# Patient Record
Sex: Male | Born: 1995
Health system: Southern US, Community
[De-identification: ages and names within clinical notes are randomized; demographics above are authoritative.]

## PROBLEM LIST (undated history)

## (undated) DIAGNOSIS — M6750 Plica syndrome, unspecified knee: Secondary | ICD-10-CM

## (undated) DIAGNOSIS — M7650 Patellar tendinitis, unspecified knee: Secondary | ICD-10-CM

## (undated) DIAGNOSIS — J309 Allergic rhinitis, unspecified: Secondary | ICD-10-CM

## (undated) DIAGNOSIS — D573 Sickle-cell trait: Secondary | ICD-10-CM

## (undated) HISTORY — PX: NO PAST SURGERIES: SHX2092

## (undated) HISTORY — DX: Allergic rhinitis, unspecified: J30.9

## (undated) HISTORY — DX: Patellar tendinitis, unspecified knee: M76.50

## (undated) HISTORY — DX: Sickle-cell trait: D57.3

## (undated) HISTORY — DX: Plica syndrome, unspecified knee: M67.50

---
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
## 2015-01-08 IMAGING — CR DG FOOT COMPLETE 3+V*R*
3 series · 3 of 3 positions shown · non-contrast
Comparison: None.

CLINICAL DATA: Pain

RIGHT FOOT COMPLETE - 3+ VIEW

[view not recorded (1 of 3)]
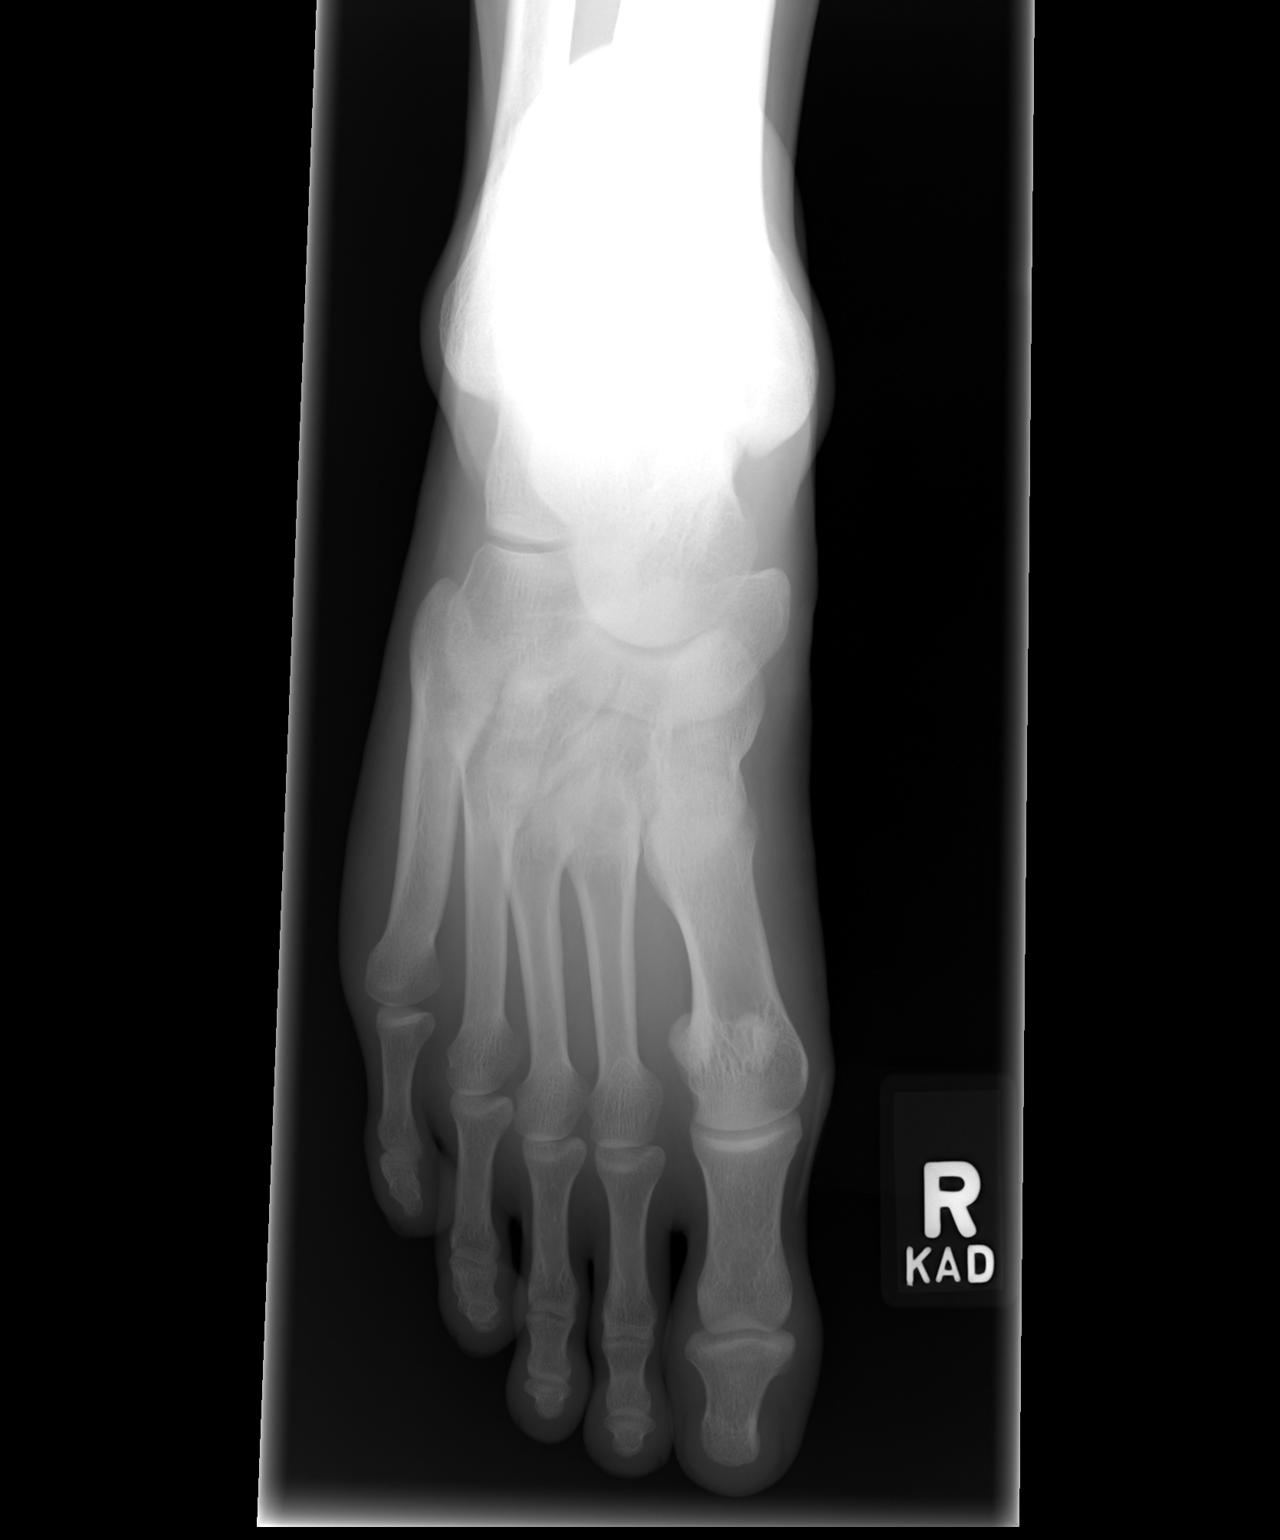

[view not recorded (2 of 3)]
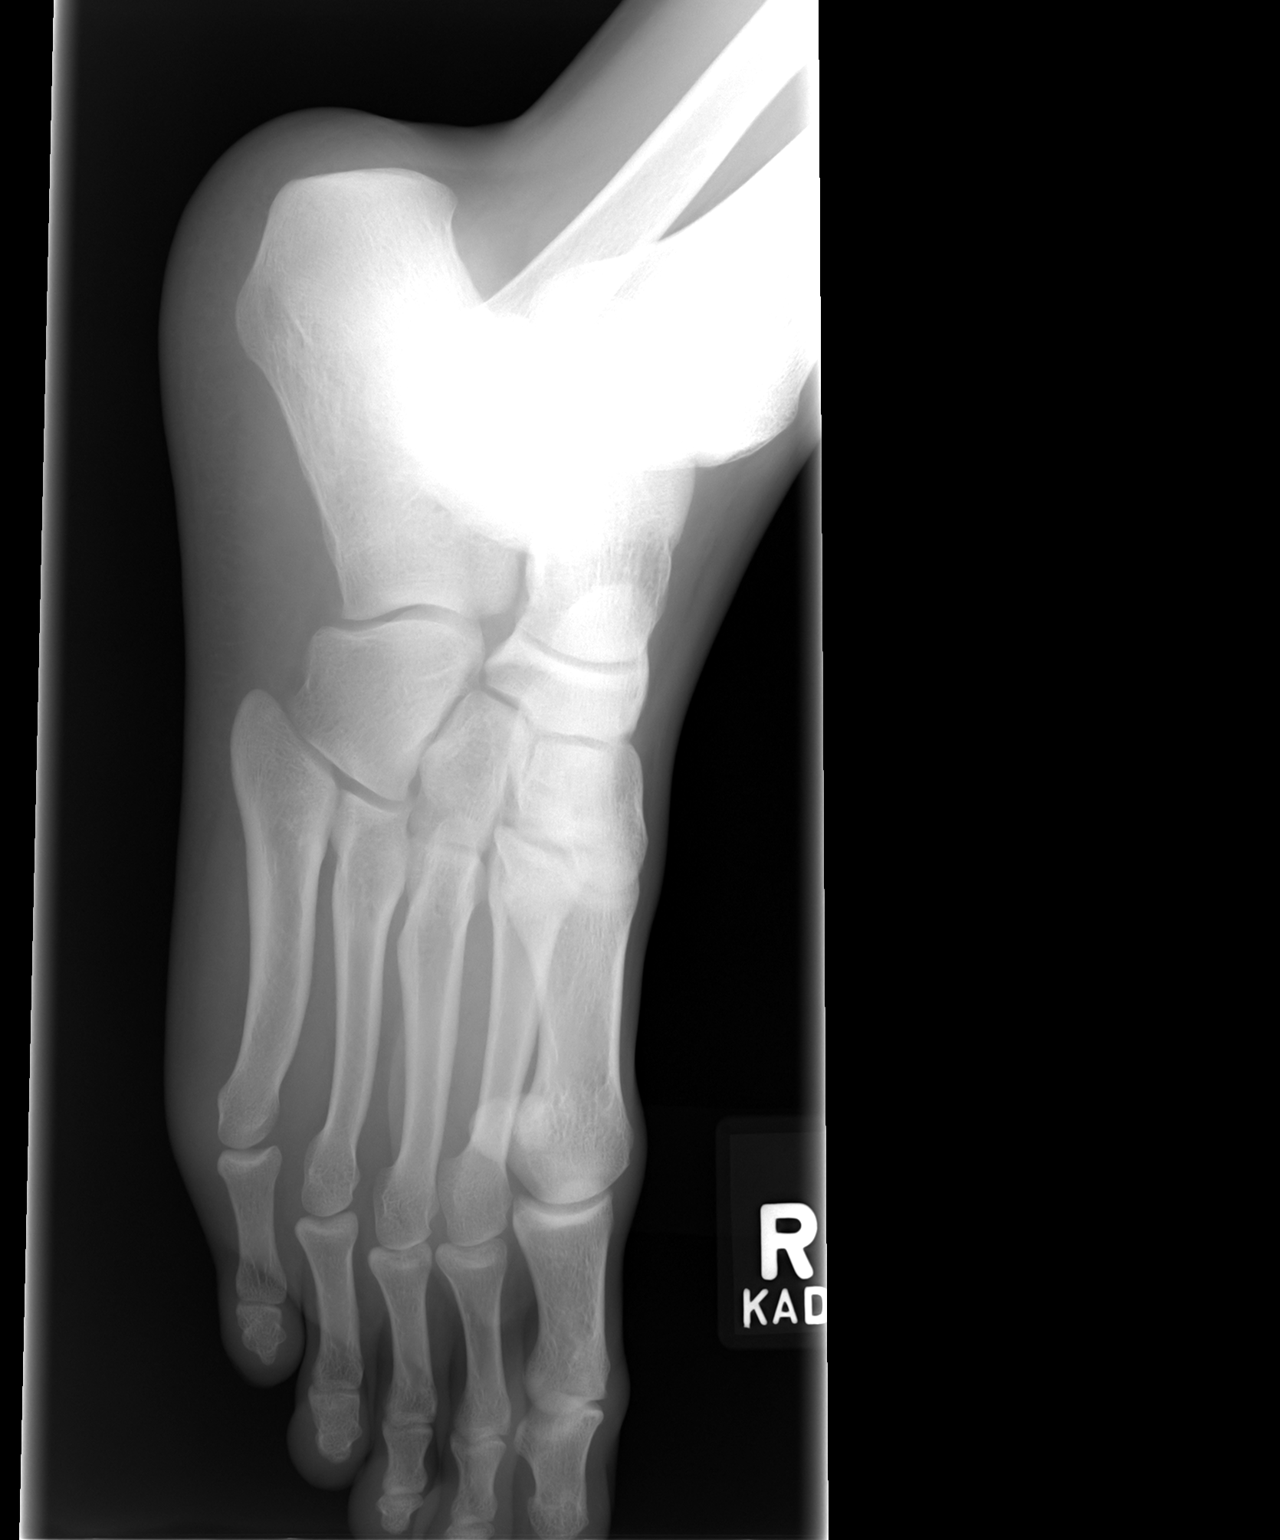

[view not recorded (3 of 3)]
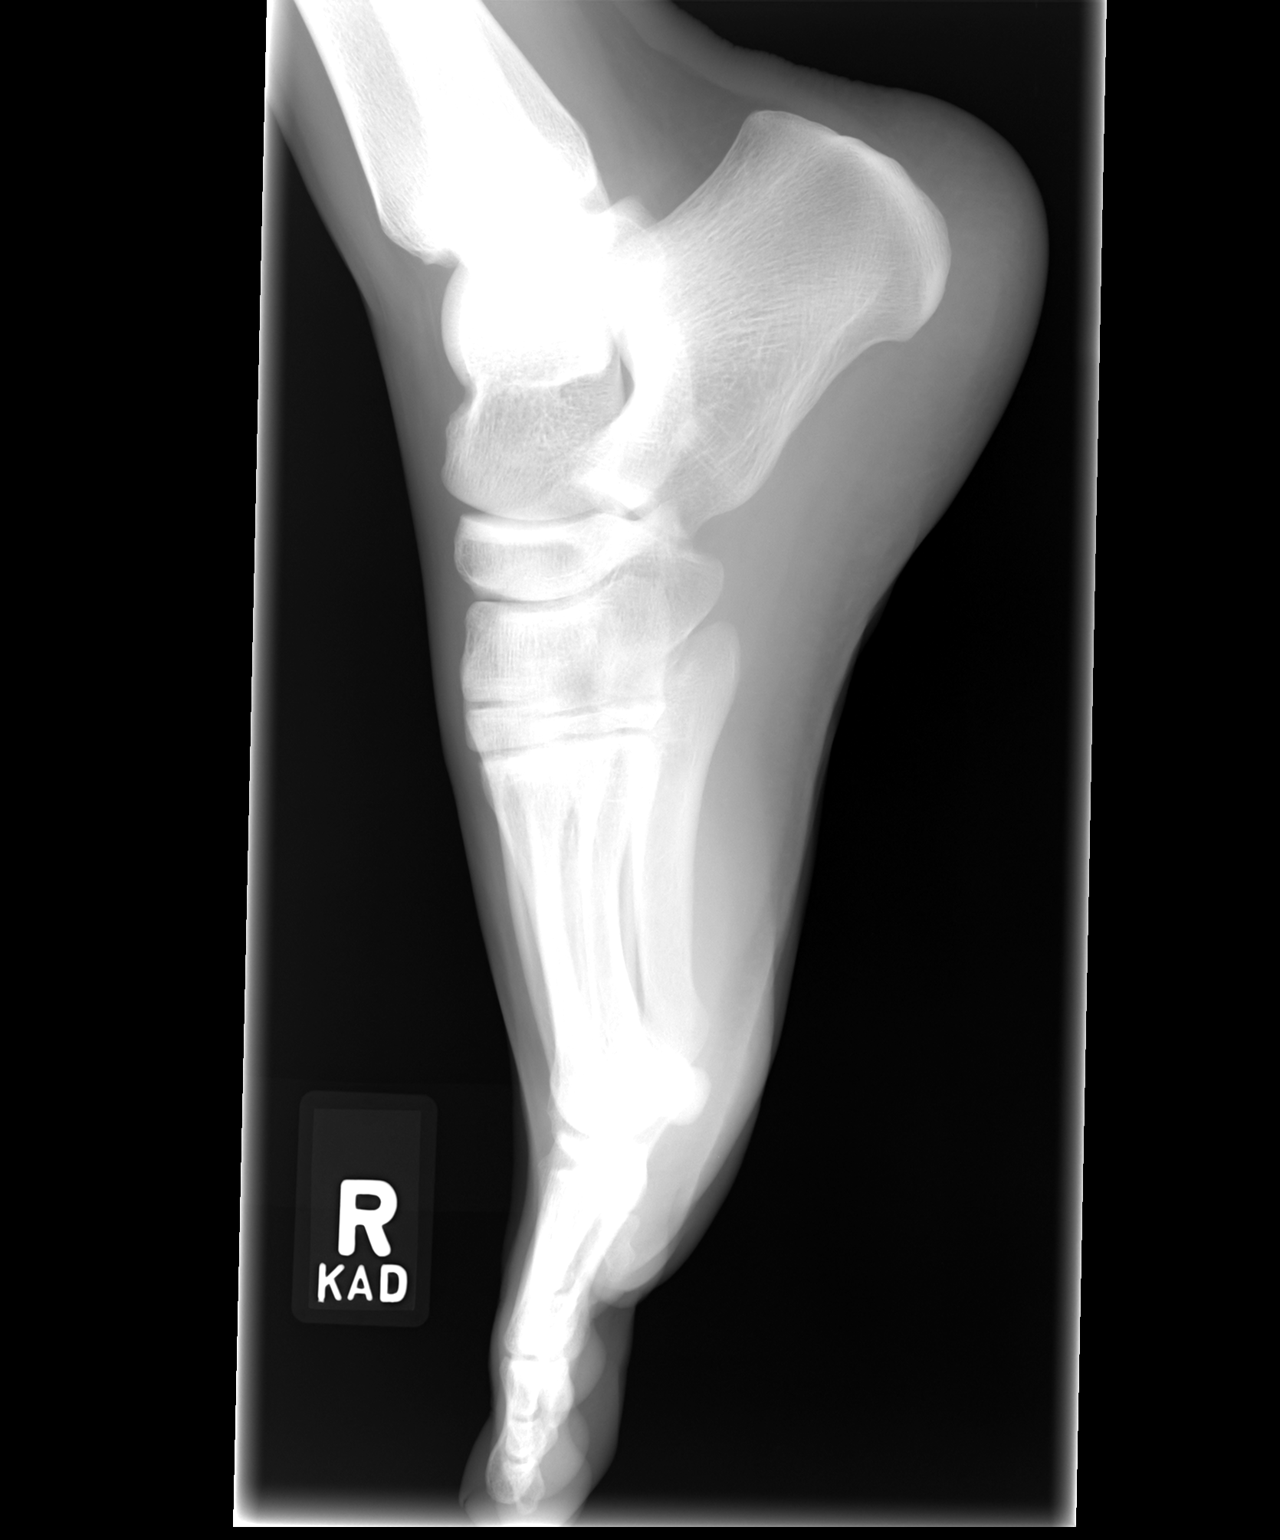

[3 of 3 positions shown; findings below may reference images not displayed]

FINDINGS: Frontal, oblique, and lateral views were obtained.  No
fracture or dislocation.  Joint spaces appear intact.  No erosive
change.
IMPRESSION: No abnormality noted.

## 2015-06-30 ENCOUNTER — Telehealth: Payer: Self-pay | Admitting: Family Medicine

## 2015-06-30 NOTE — Telephone Encounter (Signed)
TELEPHONE ADVICE RECORD [facility]  Patient Name: Cole Sanders  DOB: 12/08/1995    Initial Comment Caller states grandson needs sinus prescription for allergies.   Nurse Assessment  Nurse: Odis LusterBowers, RN, Bjorn Loserhonda Date/Time (Eastern Time): 06/30/2015 5:30:21 PM  Confirm and document reason for call. If symptomatic, describe symptoms. You must click the next button to save text entered. ---Caller reports that he has had some congestion in his nose, sneezing and watery eyes. has this problem when the seasons change. Reports that he usually takes a nasal spray and some for allergies. Reports that he hasn't seen the MD for awhile for allergies. Montelukast (Singulair), has been taking since Dec. 10 mg daily.  Has the patient traveled out of the country within the last 30 days? ---No  Does the patient have any new or worsening symptoms? ---Yes  Will a triage be completed? ---Yes  Related visit to physician within the last 2 weeks? ---No  Does the PT have any chronic conditions? (i.e. diabetes, asthma, etc.) ---Yes  List chronic conditions. ---allergies,  Is this a behavioral health or substance abuse call? ---No     Guidelines    Guideline Title Affirmed Question Affirmed Notes  Nasal Allergies (Hay Fever) [1] Nasal allergies AND [2] only certain times of year (hay fever) (all triage questions negative)    Final Disposition User   Home Care Scott AFBBowers, RN, Bjorn Loserhonda    Comments  Grandmother, caller, explained that she is not currently with the patient, he has gone back to school and his number is [phone].  Caller requests that allergy medication on file be filled when possible, his grandparents will pick up and get to him at school. He will use OTC meds until that time.   Disagree/Comply: Comply

## 2015-07-01 MED ORDER — FLUTICASONE PROPIONATE 50 MCG/ACT NA SUSP: NASAL | Status: DC

## 2015-07-01 MED ORDER — FLUTICASONE PROPIONATE 50 MCG/ACT NA SUSP: NASAL | Status: AC

## 2015-07-01 NOTE — Telephone Encounter (Signed)
I refilled his flonase-please call it in since he is away- ? Which pharmacy

## 2015-07-01 NOTE — Telephone Encounter (Signed)
Pt notified Rx filled pt wanted Rx to go to pharmacy on file and he will have his grandparents pick it up for him

## 2015-07-18 ENCOUNTER — Telehealth: Payer: Self-pay | Admitting: Family Medicine

## 2015-07-18 NOTE — Telephone Encounter (Signed)
PLEASE NOTE: All timestamps contained within this report are represented as [place] Standard Time. CONFIDENTIALTY NOTICE: This fax transmission is intended only for the addressee. It contains information that is legally privileged, confidential or otherwise protected from use or disclosure. If you are not the intended recipient, you are strictly prohibited from reviewing, disclosing, copying using or disseminating any of this information or taking any action in reliance on or regarding this information. If you have received this fax in error, please notify us immediately by telephone so that we can arrange for its return to us. Phone: [phone], Toll-Free: [phone], Fax: [phone] Page: 1 of 2 Call Id: [id number] [facility] Client TELEPHONE ADVICE RECORD [facility] Patient Name: Cole SproutAYLOR Reesor Gender: Male DOB: 09/28/1995 Age: [age] Y 2 M 27 D Return Phone Number: [phone] (Primary), [phone] (Secondary), [phone] (Alternate) Address: City/State/Zip: [place] Client [facility] Physician [place], Idamae SchullerMarne - MD Contact Type Call Who Is Calling Patient / Member / Family / Caregiver Call Type Triage / Clinical Caller Name Cole Sanders Relationship To Patient Mother Return Phone Number [phone] (Primary) Chief Complaint Headache Reason for Call Symptomatic / Request for Health Information Initial Comment caller states son has nasal allergies, facial pressure and headache Appointment Disposition EMR Appointment Not Necessary Info pasted into Epic Yes PreDisposition Call Doctor Translation No Nurse Assessment Nurse: Stefano GaulStringer, RN, Dwana CurdVera Date/Time (Eastern Time): 07/18/2015 2:41:42 PM Confirm and document reason for call. If symptomatic, describe symptoms. You must click the next button to save text entered. ---Caller states he is congested. Has  sinus headache and pressure. Has had symptoms about 2 days. no fever. Has the patient traveled out of the country within the last 30 days? ---Not Applicable Does the patient have any new or worsening symptoms? ---Yes Will a triage be completed? ---Yes Related visit to physician within the last 2 weeks? ---No Does the PT have any chronic conditions? (i.e. diabetes, asthma, etc.) ---No Is this a behavioral health or substance abuse call? ---No Guidelines Guideline Title Affirmed Question Affirmed Notes Nurse Date/Time Lamount Cohen(Eastern Time) Sinus Pain or Congestion [1] Sinus congestion as part of a cold AND [2] present < 10 days (all triage questions negative) Stefano GaulStringer, RN, Dwana CurdVera 07/18/2015 2:43:41 PM Disp. Time Lamount Cohen(Eastern Time) Disposition Final User 07/18/2015 2:36:16 PM Attempt made - message left Stefano GaulStringer, RN, Dwana CurdVera PLEASE NOTE: All timestamps contained within this report are represented as [place] Standard Time. CONFIDENTIALTY NOTICE: This fax transmission is intended only for the addressee. It contains information that is legally privileged, confidential or otherwise protected from use or disclosure. If you are not the intended recipient, you are strictly prohibited from reviewing, disclosing, copying using or disseminating any of this information or taking any action in reliance on or regarding this information. If you have received this fax in error, please notify us immediately by telephone so that we can arrange for its return to us. Phone: [phone], Toll-Free: [phone], Fax: [phone] Page: 2 of 2 Call Id: [id number] Disp. Time Lamount Cohen(Eastern Time) Disposition Final User 07/18/2015 2:38:28 PM Attempt made - message left Stefano GaulStringer, RN, Dwana CurdVera 07/18/2015 2:47:03 PM Home Care Yes Stefano GaulStringer, RN, Clerance LavVera Caller Understands: Yes Disagree/Comply: Comply Care Advice Given Per Guideline HOME CARE: You should be able to treat this at home. REASSURANCE: * Sinus congestion is a normal part of a cold.  NASAL DECONGESTANTS FOR A VERY STUFFY NOSE: * PSEUDOEPHEDRINE (Sudafed) is available OTC  in pill form. Typical adult dosage is two 30 mg tablets every 6 hours. * OXYMETAZOLINE NASAL DROPS (Afrin) are available OTC. Clean out the nose before using. Spray each nostril once, wait one minute for absorption, and then spray a second time. PAIN MEDICINES: * For pain relief, take acetaminophen, ibuprofen, or naproxen. CALL BACK IF: * Fever lasts over 3 days * Severe pain persists over 2 hours after pain medicine * Sinus congestion (fullness) persists over 10 days * You become worse. CARE ADVICE given per Sinus Pain or Congestion (Adult) guideline. Comments User: Art Buff, RN Date/Time Lamount Cohen Time): 07/18/2015 2:34:06 PM Caller states her son has nasal congestion and headache. She is not with her son. Advised caller that I would try to call her son regarding symptoms. User: Art Buff, RN Date/Time Lamount Cohen Time): 07/18/2015 2:35:53 PM son's phone number is [phone] User: Art Buff, RN Date/Time Lamount Cohen Time): 07/18/2015 2:41:08 PM called mother back and she said to try his grandparent's number at [phone]

## 2015-07-18 NOTE — Telephone Encounter (Signed)
Make an appointment if no improvement with home care recommendations, seek care after hours if symptoms become severe

## 2015-07-18 NOTE — Telephone Encounter (Signed)
Patient Name: Gwyneth SproutAYLOR No  DOB: 10/22/1995    Initial Comment caller states son has nasal allergies, facial pressure and headache   Nurse Assessment  Nurse: Stefano GaulStringer, RN, Dwana CurdVera Date/Time (Eastern Time): 07/18/2015 2:41:42 PM  Confirm and document reason for call. If symptomatic, describe symptoms. You must click the next button to save text entered. ---Caller states he is congested. Has sinus headache and pressure. Has had symptoms about 2 days. no fever.  Has the patient traveled out of the country within the last 30 days? ---Not Applicable  Does the patient have any new or worsening symptoms? ---Yes  Will a triage be completed? ---Yes  Related visit to physician within the last 2 weeks? ---No  Does the PT have any chronic conditions? (i.e. diabetes, asthma, etc.) ---No  Is this a behavioral health or substance abuse call? ---No     Guidelines    Guideline Title Affirmed Question Affirmed Notes  Sinus Pain or Congestion [1] Sinus congestion as part of a cold AND [2] present < 10 days (all triage questions negative)    Final Disposition User   Home Care RodessaStringer, RN, Dwana CurdVera    Comments  Caller states her son has nasal congestion and headache. She is not with her son. Advised caller that I would try to call her son regarding symptoms.  son's phone number is [phone]  called mother back and she said to try his grandparent's number at [phone]   Disagree/Comply: Comply

## 2015-07-18 NOTE — Telephone Encounter (Signed)
Pt last seen 09/11/2012; should pt schedule appt.?

## 2015-07-21 NOTE — Telephone Encounter (Signed)
Father schedule acute appt for pt for tomorrow, father said sxs are still the same

## 2015-07-22 ENCOUNTER — Ambulatory Visit (INDEPENDENT_AMBULATORY_CARE_PROVIDER_SITE_OTHER): Payer: Federal, State, Local not specified - PPO | Admitting: Family Medicine

## 2015-07-22 ENCOUNTER — Encounter: Payer: Self-pay | Admitting: Family Medicine

## 2015-07-22 VITALS — BP 108/76 | HR 51 | Temp 98.2°F | Ht 75.0 in | Wt 194.5 lb

## 2015-07-22 DIAGNOSIS — J0110 Acute frontal sinusitis, unspecified: Secondary | ICD-10-CM | POA: Diagnosis not present

## 2015-07-22 DIAGNOSIS — J302 Other seasonal allergic rhinitis: Secondary | ICD-10-CM | POA: Diagnosis not present

## 2015-07-22 DIAGNOSIS — J019 Acute sinusitis, unspecified: Secondary | ICD-10-CM | POA: Insufficient documentation

## 2015-07-22 MED ORDER — AZITHROMYCIN 250 MG PO TABS: ORAL_TABLET | ORAL | Status: AC

## 2015-07-22 NOTE — Progress Notes (Signed)
Pre visit review using our clinic review tool, if applicable. No additional management support is needed unless otherwise documented below in the visit note. 

## 2015-07-22 NOTE — Patient Instructions (Signed)
Continue flonase through the allergy season (at least another month) for allergies  Take zpak for sinus infection Drink lots of fluids Warm compresses on face and breathing steam also help  advil is ok - take it with food   Update if not starting to improve in a week or if worsening

## 2015-07-22 NOTE — Progress Notes (Signed)
Subjective:    Patient ID: Cole Sanders, male    DOB: 04/28/1995, 20 y.o.   MRN: [id number]  HPI Thinks he has a sinus infection   Has allergies - baseline congestion   Saturday- had worse congestion and headache - mid forehead  Mucous was green/yellow  Some cough-no fever (dry cough)  Ears feel ok  Throat - feels drainage but not sore   advil helps the headache some  Also started back on flonase  Has not used saline   Patient Active Problem List   Diagnosis Date Noted  . Pain of right heel 09/11/2012  . Heart murmur 08/16/2011  . Well adolescent visit 01/08/2011  . TENDINITIS, PATELLAR 08/18/2009  . 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 KNEE PAIN 02/05/2009  . ALLERGIC RHINITIS 12/23/2006  . SICKLE CELL TRAIT 12/20/2006   Past Medical History  Diagnosis Date  . Allergic rhinitis, cause unspecified   . Plica syndrome   . Sickle-cell trait (HCC)   . Patellar tendinitis    Past Surgical History  Procedure Laterality Date  . No past surgeries     Social History  Substance Use Topics  . Smoking status: Never Smoker   . Smokeless tobacco: Never Used  . Alcohol Use: No   Family History  Problem Relation Age of Onset  . Stomach cancer Paternal Grandfather   . Diabetes Paternal Aunt   . Diabetes Paternal Uncle   . Allergies Brother   . Sudden death Neg Hx    Allergies  Allergen Reactions  . Amoxicillin     REACTION: tongue lesions   Current Outpatient Prescriptions on File Prior to Visit  Medication Sig Dispense Refill  . fexofenadine (ALLEGRA) 180 MG tablet Take 180 mg by mouth as needed.     . fluticasone (FLONASE) 50 MCG/ACT nasal spray USE 2 SPRAYS IN EACH NOSTRIL DAILY AS NEEDED 16 g 11  . ibuprofen (ADVIL,MOTRIN) 200 MG tablet Take 200 mg by mouth as needed.       No current facility-administered medications on file prior to visit.     Review of Systems  Constitutional: Positive for appetite change. Negative for fever and fatigue.  HENT: Positive for  congestion, ear pain, postnasal drip, rhinorrhea, sinus pressure and sore throat. Negative for nosebleeds.   Eyes: Negative for pain, redness and itching.  Respiratory: Positive for cough. Negative for shortness of breath and wheezing.   Cardiovascular: Negative for chest pain.  Gastrointestinal: Negative for nausea, vomiting, abdominal pain and diarrhea.  Endocrine: Negative for polyuria.  Genitourinary: Negative for dysuria, urgency and frequency.  Musculoskeletal: Negative for myalgias and arthralgias.  Allergic/Immunologic: Negative for immunocompromised state.  Neurological: Positive for headaches. Negative for dizziness, tremors, syncope, weakness and numbness.  Hematological: Negative for adenopathy. Does not bruise/bleed easily.  Psychiatric/Behavioral: Negative for dysphoric mood. The patient is not nervous/anxious.        Objective:   Physical Exam  Constitutional: He appears well-developed and well-nourished. No distress.  Well appearing   HENT:  Head: Normocephalic and atraumatic.  Right Ear: External ear normal.  Left Ear: External ear normal.  Mouth/Throat: Oropharynx is clear and moist. No oropharyngeal exudate.  Eyes: Conjunctivae and EOM are normal. Pupils are equal, round, and reactive to light. Right eye exhibits no discharge. Left eye exhibits no discharge.  Neck: Normal range of motion. Neck supple.  Cardiovascular: Normal rate and regular rhythm.   Pulmonary/Chest: Effort normal and breath sounds normal. No respiratory distress. He has no wheezes. He has  no rales. He exhibits no tenderness.  Musculoskeletal: Normal range of motion.  Lymphadenopathy:    He has no cervical adenopathy.  Neurological: He is alert. No cranial nerve deficit.  Skin: Skin is warm and dry. No rash noted. No erythema.  Psychiatric: He has a normal mood and affect.          Assessment & Plan:   Problem List Items Addressed This Visit      Respiratory   Acute sinusitis    In pt  with chronic cong with allergies  Will continue flonase tx with zithromax (cannot take pcn) does well with this Disc symptomatic care - see instructions on AVS  Update if not starting to improve in a week or if worsening        Relevant Medications   azithromycin (ZITHROMAX Z-PAK) 250 MG tablet   Allergic rhinitis - Primary

## 2015-07-22 NOTE — Assessment & Plan Note (Signed)
In pt with chronic cong with allergies  Will continue flonase tx with zithromax (cannot take pcn) does well with this Disc symptomatic care - see instructions on AVS  Update if not starting to improve in a week or if worsening

## 2015-11-24 ENCOUNTER — Other Ambulatory Visit: Payer: Self-pay | Admitting: Family Medicine

## 2015-11-26 ENCOUNTER — Telehealth: Payer: Self-pay | Admitting: Family Medicine

## 2015-11-26 NOTE — Telephone Encounter (Signed)
Soyla DryerJoe Difiore, pt father called. Stated his son is in school full time and has a sinus infection and wanted to know if Dr. Milinda Antisower could call in abx. I explained to pt's father that we do not prescribe abx without being seen and if he has a full school schedule then he would benefit from going to and UC or after hr clinic. Pt father was no satisfied with what I told him.  Pt father insisted Dr. Milinda Antisower call him back directly. Please advise  [phone]

## 2015-11-26 NOTE — Telephone Encounter (Signed)
UNABLE TO LEAVE MESSAGE

## 2015-11-26 NOTE — Telephone Encounter (Signed)
I cannot px abx over the phone unfortunately-he has to be seen.  If there is anything else they need-please let me know - ie: allergy treatment/ etc

## 2016-02-07 ENCOUNTER — Encounter (HOSPITAL_COMMUNITY): Payer: Self-pay | Admitting: Emergency Medicine

## 2016-02-07 ENCOUNTER — Ambulatory Visit (HOSPITAL_COMMUNITY)
Admission: EM | Admit: 2016-02-07 | Discharge: 2016-02-07 | Disposition: A | Discharge status: Home / Self Care | Payer: Federal, State, Local not specified - PPO | Attending: Emergency Medicine | Admitting: Emergency Medicine

## 2016-02-07 DIAGNOSIS — B86 Scabies: Secondary | ICD-10-CM

## 2016-02-07 MED ORDER — PERMETHRIN 5 % EX CREA: TOPICAL_CREAM | CUTANEOUS | 1 refills | Status: DC

## 2016-02-07 NOTE — ED Triage Notes (Signed)
Here for rash onset 2 weeks on bilateral hands and foot   Taking OTC hydrocortisone cream w/no relief.   A&O x4... NAD

## 2016-02-07 NOTE — ED Provider Notes (Signed)
[facility]    CSN: [id number] Arrival date & time: 02/07/16  1320     History   Chief Complaint Chief Complaint  Patient presents with  . Rash    HPI Cole Sanders is a 20 y.o. male.   HPI  He is a 20 year old man here for evaluation of rash. He states it started 2 weeks ago. At first, it was very itchy. He has been using hydrocortisone cream which has seemed to help with itching. Lesions are primarily on his hands and feet. He does have a few spots on his thighs. No fevers. No other complaints.  Past Medical History:  Diagnosis Date  . Allergic rhinitis, cause unspecified   . Patellar tendinitis   . Plica syndrome   . Sickle-cell trait [facility](HCC)     Patient Active Problem List   Diagnosis Date Noted  . Acute sinusitis 07/22/2015  . Pain of right heel 09/11/2012  . Heart murmur 08/16/2011  . Well adolescent visit 01/08/2011  . TENDINITIS, PATELLAR 08/18/2009  . 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 KNEE PAIN 02/05/2009  . Allergic rhinitis 12/23/2006  . SICKLE CELL TRAIT 12/20/2006    Past Surgical History:  Procedure Laterality Date  . NO PAST SURGERIES         Home Medications    Prior to Admission medications   Medication Sig Start Date End Date Taking? Authorizing Provider  fexofenadine (ALLEGRA) 180 MG tablet Take 180 mg by mouth as needed.    Yes Historical Provider, MD  fluticasone (FLONASE) 50 MCG/ACT nasal spray USE 2 SPRAYS IN EACH NOSTRIL DAILY AS NEEDED 07/01/15  Yes Judy PimpleMarne A Tower, MD  ibuprofen (ADVIL,MOTRIN) 200 MG tablet Take 200 mg by mouth as needed.      Historical Provider, MD  permethrin (ELIMITE) 5 % cream Apply from neck down.  Leave on 12 hours then wash off.  Repeat in 1 week if needed. 02/07/16   Charm RingsErin J Congetta Odriscoll, MD    Family History Family History  Problem Relation Age of Onset  . Stomach cancer Paternal Grandfather   . Diabetes Paternal Aunt   . Diabetes Paternal Uncle   . Allergies Brother   . Sudden death Neg Hx      Social History Social History  Substance Use Topics  . Smoking status: Never Smoker  . Smokeless tobacco: Never Used  . Alcohol use No     Allergies   Amoxicillin   Review of Systems Review of Systems As in history of present illness  Physical Exam Triage Vital Signs ED Triage Vitals [02/07/16 1354]  Enc Vitals Group     BP 127/71     Pulse Rate 64     Resp 16     Temp 98.5 F (36.9 C)     Temp Source Oral     SpO2 100 %     Weight      Height      Head Circumference      Peak Flow      Pain Score      Pain Loc      Pain Edu?      Excl. in GC?    No data found.   Updated Vital Signs BP 127/71 (BP Location: Left Arm)   Pulse 64   Temp 98.5 F (36.9 C) (Oral)   Resp 16   SpO2 100%   Visual Acuity Right Eye Distance:   Left Eye Distance:   Bilateral Distance:    Right  Eye Near:   Left Eye Near:    Bilateral Near:     Physical Exam  Constitutional: He is oriented to person, place, and time. He appears well-developed and well-nourished. No distress.  Cardiovascular: Normal rate.   Pulmonary/Chest: Effort normal.  Neurological: He is alert and oriented to person, place, and time.  Skin: Rash (papular rash in web spaces on hands and dorsal feet) noted.     UC Treatments / Results  Labs (all labs ordered are listed, but only abnormal results are displayed) Labs Reviewed - No data to display  EKG  EKG Interpretation None       Radiology No results found.  Procedures Procedures (including critical care time)  Medications Ordered in UC Medications - No data to display   Initial Impression / Assessment and Plan / UC Course  I have reviewed the triage vital signs and the nursing notes.  Pertinent labs & imaging results that were available during my care of the patient were reviewed by me and considered in my medical decision making (see chart for details).  Clinical Course     We'll treat for scabies with permethrin  cream. Handout given.  Final Clinical Impressions(s) / UC Diagnoses   Final diagnoses:  Scabies    New Prescriptions New Prescriptions   PERMETHRIN (ELIMITE) 5 % CREAM    Apply from neck down.  Leave on 12 hours then wash off.  Repeat in 1 week if needed.     Charm RingsErin J Yordy Matton, MD 02/07/16 [phone]

## 2016-02-07 NOTE — Discharge Instructions (Signed)
This is scabies. Use permethrin cream as correct. Make sure you wash all of your clothing, bedding, and towels and dry on high heat. After using the cream, you should not get any new spots. If you do, do the cream again. Follow-up as needed.

## 2020-04-21 ENCOUNTER — Ambulatory Visit: Payer: Federal, State, Local not specified - PPO | Admitting: Family Medicine

## 2020-04-21 ENCOUNTER — Encounter: Payer: Self-pay | Admitting: Family Medicine

## 2020-04-21 ENCOUNTER — Other Ambulatory Visit: Payer: Self-pay

## 2020-04-21 VITALS — BP 110/66 | HR 67 | Temp 97.4°F | Resp 14 | Ht 76.0 in | Wt 195.3 lb

## 2020-04-21 DIAGNOSIS — Z23 Encounter for immunization: Secondary | ICD-10-CM | POA: Diagnosis not present

## 2020-04-21 DIAGNOSIS — Z111 Encounter for screening for respiratory tuberculosis: Secondary | ICD-10-CM

## 2020-04-21 DIAGNOSIS — Z020 Encounter for examination for admission to educational institution: Secondary | ICD-10-CM | POA: Diagnosis not present

## 2020-04-21 DIAGNOSIS — J301 Allergic rhinitis due to pollen: Secondary | ICD-10-CM

## 2020-04-21 DIAGNOSIS — D573 Sickle-cell trait: Secondary | ICD-10-CM

## 2020-04-21 NOTE — Assessment & Plan Note (Signed)
No problems now or in the past  Has been in athletics

## 2020-04-21 NOTE — Patient Instructions (Addendum)
TB test today   Update tetanus and varcilla vaccine   Take care of yourself  Eat a balanced diet  Keep exercising

## 2020-04-21 NOTE — Assessment & Plan Note (Signed)
TB gold today  For school

## 2020-04-21 NOTE — Progress Notes (Signed)
Subjective:    Patient ID: Cole Sanders, male    DOB: 08-25-1995, 25 y.o.   MRN: [id number]  This visit occurred during the SARS-CoV-2 public health emergency.  Safety protocols were in place, including screening questions prior to the visit, additional usage of staff PPE, and extensive cleaning of exam room while observing appropriate contact time as indicated for disinfecting solutions.    HPI Pt presents to rev imm requirements for school  Wt Readings from Last 3 Encounters:  04/21/20 195 lb 5 oz (88.6 kg)  07/22/15 194 lb 8 oz (88.2 kg)  09/11/12 167 lb 8 oz (76 kg) (74 %, Z= 0.65)*   * Growth percentiles are based on CDC (Boys, 2-20 Years) data.   23.77 kg/m  Grad undergrad at [employer] wise doing very well  Goes to [employer] camp for exercise    Going to [employer] - grad school  Studying public health  Looking at epidemiology or nursing   H/o Hazel trait  No problems with this in athletics    TB status -needs screen   T dap 2/07- due for  covid imm - getting booster this week  Flu 12/21 He had one varicella vaccine   BP Readings from Last 3 Encounters:  04/21/20 110/66  02/07/16 127/71  07/22/15 108/76   Pulse Readings from Last 3 Encounters:  04/21/20 67  02/07/16 64  07/22/15 (!) 51   Patient Active Problem List   Diagnosis Date Noted  . Encounter for school examination 04/21/2020  . Screening-pulmonary TB 04/21/2020  . Pain of right heel 09/11/2012  . Heart murmur 08/16/2011  . Well adolescent visit 01/08/2011  . TENDINITIS, PATELLAR 08/18/2009  . 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 KNEE PAIN 02/05/2009  . Allergic rhinitis 12/23/2006  . SICKLE CELL TRAIT 12/20/2006   Past Medical History:  Diagnosis Date  . Allergic rhinitis, cause unspecified   . Patellar tendinitis   . Plica syndrome   . Sickle-cell trait [facility])    Past Surgical History:  Procedure Laterality Date  . NO PAST SURGERIES     Social History   Tobacco Use   . Smoking status: Never Smoker  . Smokeless tobacco: Never Used  Substance Use Topics  . Alcohol use: No    Alcohol/week: 0.0 standard drinks  . Drug use: No   Family History  Problem Relation Age of Onset  . Stomach cancer Paternal Grandfather   . Diabetes Paternal Aunt   . Diabetes Paternal Uncle   . Allergies Brother   . Sudden death Neg Hx    Allergies  Allergen Reactions  . Amoxicillin     REACTION: tongue lesions   Current Outpatient Medications on File Prior to Visit  Medication Sig Dispense Refill  . fexofenadine (ALLEGRA) 180 MG tablet Take 180 mg by mouth as needed.     . fluticasone (FLONASE) 50 MCG/ACT nasal spray USE 2 SPRAYS IN EACH NOSTRIL DAILY AS NEEDED 16 g 11  . ibuprofen (ADVIL,MOTRIN) 200 MG tablet Take 200 mg by mouth as needed.       No current facility-administered medications on file prior to visit.    Review of Systems  Constitutional: Negative for activity change, appetite change, fatigue, fever and unexpected weight change.  HENT: Negative for congestion, rhinorrhea, sore throat and trouble swallowing.   Eyes: Negative for pain, redness, itching and visual disturbance.  Respiratory: Negative for cough, chest tightness, shortness of breath and wheezing.   Cardiovascular:  Negative for chest pain and palpitations.  Gastrointestinal: Negative for abdominal pain, blood in stool, constipation, diarrhea and nausea.  Endocrine: Negative for cold intolerance, heat intolerance, polydipsia and polyuria.  Genitourinary: Negative for difficulty urinating, dysuria, frequency and urgency.  Musculoskeletal: Negative for arthralgias, joint swelling and myalgias.  Skin: Negative for pallor and rash.  Neurological: Negative for dizziness, tremors, weakness, numbness and headaches.  Hematological: Negative for adenopathy. Does not bruise/bleed easily.  Psychiatric/Behavioral: Positive for decreased concentration and dysphoric mood. The patient is not  nervous/anxious.        Objective:   Physical Exam Constitutional:      General: He is not in acute distress.    Appearance: Normal appearance. He is well-developed, normal weight and well-nourished. He is not ill-appearing.  HENT:     Head: Normocephalic and atraumatic.     Nose: No congestion.     Mouth/Throat:     Mouth: Oropharynx is clear and moist.  Eyes:     Extraocular Movements: EOM normal.     Conjunctiva/sclera: Conjunctivae normal.     Pupils: Pupils are equal, round, and reactive to light.  Neck:     Thyroid: No thyromegaly.     Vascular: No carotid bruit or JVD.  Cardiovascular:     Rate and Rhythm: Normal rate and regular rhythm.     Pulses: Intact distal pulses.     Heart sounds: Normal heart sounds. No gallop.   Pulmonary:     Effort: Pulmonary effort is normal. No respiratory distress.     Breath sounds: Normal breath sounds. No wheezing or rales.     Comments: No crackles Abdominal:     General: Bowel sounds are normal. There is no distension or abdominal bruit.     Palpations: Abdomen is soft. There is no mass.     Tenderness: There is no abdominal tenderness.  Musculoskeletal:        General: No tenderness or edema.     Cervical back: Normal range of motion and neck supple. No rigidity or tenderness.     Left lower leg: No edema.  Lymphadenopathy:     Cervical: No cervical adenopathy.  Skin:    General: Skin is warm and dry.     Coloration: Skin is not pale.     Findings: No erythema or rash.  Neurological:     Mental Status: He is alert.     Coordination: Coordination normal.     Deep Tendon Reflexes: Reflexes are normal and symmetric. Reflexes normal.  Psychiatric:        Mood and Affect: Mood and affect and mood normal.     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

## 2020-04-21 NOTE — Assessment & Plan Note (Addendum)
Healthy  No limitations for school/(grad school) May someday work in clinical setting  Td and varicella updated  TB gold test today  Declines need for STD testing  Enc good self care diet /exercise  Plans to get covid booster as well

## 2020-04-21 NOTE — Assessment & Plan Note (Signed)
Improved from when he was younger otc tx prn

## 2020-04-23 LAB — QUANTIFERON-TB GOLD PLUS
Mitogen-NIL: >10 IU/mL
NIL: 0.06 IU/mL
QuantiFERON-TB Gold Plus: NEGATIVE
TB1-NIL: 0.01 IU/mL
TB2-NIL: 0.01 IU/mL

## 2020-04-24 ENCOUNTER — Telehealth: Payer: Self-pay | Admitting: *Deleted

## 2020-04-24 ENCOUNTER — Encounter: Payer: Self-pay | Admitting: *Deleted

## 2020-04-24 NOTE — Telephone Encounter (Signed)
-----   Message from Judy Pimple, MD sent at 04/23/2020  8:18 PM EST ----- TB test is negative  Please send him a copy

## 2020-04-24 NOTE — Telephone Encounter (Signed)
Called pt's home and phone # off, called pt's cell # and no VM set up

## 2020-04-25 ENCOUNTER — Encounter: Payer: Self-pay | Admitting: *Deleted

## 2020-12-02 ENCOUNTER — Telehealth: Payer: Self-pay | Admitting: Family Medicine

## 2020-12-02 NOTE — Telephone Encounter (Signed)
Copy mailed.

## 2020-12-02 NOTE — Telephone Encounter (Signed)
Pt call in requesting to get a copy or his negative TB test result mail to [address] [place]  [place] [address]

## 2022-09-22 DIAGNOSIS — Z0000 Encounter for general adult medical examination without abnormal findings: Secondary | ICD-10-CM | POA: Diagnosis not present

## 2022-09-22 DIAGNOSIS — Z13220 Encounter for screening for lipoid disorders: Secondary | ICD-10-CM | POA: Diagnosis not present

## 2022-09-22 DIAGNOSIS — Z131 Encounter for screening for diabetes mellitus: Secondary | ICD-10-CM | POA: Diagnosis not present

## 2022-09-22 DIAGNOSIS — Z23 Encounter for immunization: Secondary | ICD-10-CM | POA: Diagnosis not present

## 2022-11-05 DIAGNOSIS — D573 Sickle-cell trait: Secondary | ICD-10-CM | POA: Diagnosis not present

## 2022-11-05 DIAGNOSIS — Z113 Encounter for screening for infections with a predominantly sexual mode of transmission: Secondary | ICD-10-CM | POA: Diagnosis not present

## 2022-11-05 DIAGNOSIS — R7303 Prediabetes: Secondary | ICD-10-CM | POA: Diagnosis not present

## 2022-12-02 DIAGNOSIS — Z206 Contact with and (suspected) exposure to human immunodeficiency virus [HIV]: Secondary | ICD-10-CM | POA: Diagnosis not present

## 2023-01-13 DIAGNOSIS — Z206 Contact with and (suspected) exposure to human immunodeficiency virus [HIV]: Secondary | ICD-10-CM | POA: Diagnosis not present

## 2023-02-03 DIAGNOSIS — Z206 Contact with and (suspected) exposure to human immunodeficiency virus [HIV]: Secondary | ICD-10-CM | POA: Diagnosis not present
